# Patient Record
Sex: Female | Born: 2000
Health system: Southern US, Community
[De-identification: ages and names within clinical notes are randomized; demographics above are authoritative.]

## PROBLEM LIST (undated history)

## (undated) DIAGNOSIS — E669 Obesity, unspecified: Secondary | ICD-10-CM

## (undated) HISTORY — PX: OTHER SURGICAL HISTORY: SHX169

## (undated) HISTORY — PX: FACIAL NERVE SURGERY: SHX630

## (undated) HISTORY — PX: EYE SURGERY: SHX253

## (undated) HISTORY — DX: Obesity, unspecified: E66.9

---
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
## 2019-01-03 DIAGNOSIS — M5415 Radiculopathy, thoracolumbar region: Secondary | ICD-10-CM | POA: Diagnosis not present

## 2019-01-03 DIAGNOSIS — M546 Pain in thoracic spine: Secondary | ICD-10-CM | POA: Diagnosis not present

## 2019-01-03 DIAGNOSIS — M9902 Segmental and somatic dysfunction of thoracic region: Secondary | ICD-10-CM | POA: Diagnosis not present

## 2019-01-03 DIAGNOSIS — M7918 Myalgia, other site: Secondary | ICD-10-CM | POA: Diagnosis not present

## 2019-01-04 DIAGNOSIS — M5415 Radiculopathy, thoracolumbar region: Secondary | ICD-10-CM | POA: Diagnosis not present

## 2019-01-04 DIAGNOSIS — M546 Pain in thoracic spine: Secondary | ICD-10-CM | POA: Diagnosis not present

## 2019-01-04 DIAGNOSIS — M9902 Segmental and somatic dysfunction of thoracic region: Secondary | ICD-10-CM | POA: Diagnosis not present

## 2019-01-04 DIAGNOSIS — M7918 Myalgia, other site: Secondary | ICD-10-CM | POA: Diagnosis not present

## 2019-01-06 DIAGNOSIS — M546 Pain in thoracic spine: Secondary | ICD-10-CM | POA: Diagnosis not present

## 2019-01-06 DIAGNOSIS — M9902 Segmental and somatic dysfunction of thoracic region: Secondary | ICD-10-CM | POA: Diagnosis not present

## 2019-01-06 DIAGNOSIS — M5415 Radiculopathy, thoracolumbar region: Secondary | ICD-10-CM | POA: Diagnosis not present

## 2019-01-06 DIAGNOSIS — M7918 Myalgia, other site: Secondary | ICD-10-CM | POA: Diagnosis not present

## 2019-05-12 DIAGNOSIS — Z01419 Encounter for gynecological examination (general) (routine) without abnormal findings: Secondary | ICD-10-CM | POA: Diagnosis not present

## 2019-05-12 DIAGNOSIS — Z309 Encounter for contraceptive management, unspecified: Secondary | ICD-10-CM | POA: Diagnosis not present

## 2019-05-12 DIAGNOSIS — Z6841 Body Mass Index (BMI) 40.0 and over, adult: Secondary | ICD-10-CM | POA: Diagnosis not present

## 2019-05-12 DIAGNOSIS — Z803 Family history of malignant neoplasm of breast: Secondary | ICD-10-CM | POA: Diagnosis not present

## 2019-07-08 DIAGNOSIS — M545 Low back pain: Secondary | ICD-10-CM | POA: Diagnosis not present

## 2019-07-08 DIAGNOSIS — M62830 Muscle spasm of back: Secondary | ICD-10-CM | POA: Diagnosis not present

## 2019-07-08 DIAGNOSIS — M546 Pain in thoracic spine: Secondary | ICD-10-CM | POA: Diagnosis not present

## 2019-07-11 DIAGNOSIS — M546 Pain in thoracic spine: Secondary | ICD-10-CM | POA: Diagnosis not present

## 2019-07-11 DIAGNOSIS — M545 Low back pain: Secondary | ICD-10-CM | POA: Diagnosis not present

## 2019-07-11 DIAGNOSIS — M62830 Muscle spasm of back: Secondary | ICD-10-CM | POA: Diagnosis not present

## 2019-07-15 DIAGNOSIS — M545 Low back pain: Secondary | ICD-10-CM | POA: Diagnosis not present

## 2019-07-15 DIAGNOSIS — M62830 Muscle spasm of back: Secondary | ICD-10-CM | POA: Diagnosis not present

## 2019-07-15 DIAGNOSIS — M546 Pain in thoracic spine: Secondary | ICD-10-CM | POA: Diagnosis not present

## 2019-07-18 DIAGNOSIS — M546 Pain in thoracic spine: Secondary | ICD-10-CM | POA: Diagnosis not present

## 2019-07-18 DIAGNOSIS — M62830 Muscle spasm of back: Secondary | ICD-10-CM | POA: Diagnosis not present

## 2019-07-18 DIAGNOSIS — M545 Low back pain: Secondary | ICD-10-CM | POA: Diagnosis not present

## 2019-07-21 DIAGNOSIS — M546 Pain in thoracic spine: Secondary | ICD-10-CM | POA: Diagnosis not present

## 2019-07-21 DIAGNOSIS — M545 Low back pain: Secondary | ICD-10-CM | POA: Diagnosis not present

## 2019-07-21 DIAGNOSIS — M62830 Muscle spasm of back: Secondary | ICD-10-CM | POA: Diagnosis not present

## 2019-07-28 DIAGNOSIS — M62830 Muscle spasm of back: Secondary | ICD-10-CM | POA: Diagnosis not present

## 2019-07-28 DIAGNOSIS — M545 Low back pain: Secondary | ICD-10-CM | POA: Diagnosis not present

## 2019-07-28 DIAGNOSIS — M546 Pain in thoracic spine: Secondary | ICD-10-CM | POA: Diagnosis not present

## 2019-08-11 DIAGNOSIS — M546 Pain in thoracic spine: Secondary | ICD-10-CM | POA: Diagnosis not present

## 2019-08-11 DIAGNOSIS — M62830 Muscle spasm of back: Secondary | ICD-10-CM | POA: Diagnosis not present

## 2019-08-11 DIAGNOSIS — M545 Low back pain: Secondary | ICD-10-CM | POA: Diagnosis not present

## 2019-09-05 DIAGNOSIS — M62830 Muscle spasm of back: Secondary | ICD-10-CM | POA: Diagnosis not present

## 2019-09-05 DIAGNOSIS — M546 Pain in thoracic spine: Secondary | ICD-10-CM | POA: Diagnosis not present

## 2019-09-05 DIAGNOSIS — M545 Low back pain: Secondary | ICD-10-CM | POA: Diagnosis not present

## 2019-09-26 DIAGNOSIS — M545 Low back pain: Secondary | ICD-10-CM | POA: Diagnosis not present

## 2019-09-26 DIAGNOSIS — M62830 Muscle spasm of back: Secondary | ICD-10-CM | POA: Diagnosis not present

## 2019-09-26 DIAGNOSIS — M546 Pain in thoracic spine: Secondary | ICD-10-CM | POA: Diagnosis not present

## 2019-10-08 DIAGNOSIS — M545 Low back pain: Secondary | ICD-10-CM | POA: Diagnosis not present

## 2019-10-08 DIAGNOSIS — M62830 Muscle spasm of back: Secondary | ICD-10-CM | POA: Diagnosis not present

## 2019-10-08 DIAGNOSIS — M546 Pain in thoracic spine: Secondary | ICD-10-CM | POA: Diagnosis not present

## 2019-11-12 DIAGNOSIS — R062 Wheezing: Secondary | ICD-10-CM | POA: Diagnosis not present

## 2019-11-12 DIAGNOSIS — R05 Cough: Secondary | ICD-10-CM | POA: Diagnosis not present

## 2020-01-18 ENCOUNTER — Emergency Department (HOSPITAL_COMMUNITY)
Admission: EM | Admit: 2020-01-18 | Discharge: 2020-01-19 | Disposition: A | Discharge status: Home / Self Care | Payer: BC Managed Care – PPO | Attending: Emergency Medicine | Admitting: Emergency Medicine

## 2020-01-18 ENCOUNTER — Encounter (HOSPITAL_COMMUNITY): Payer: Self-pay

## 2020-01-18 ENCOUNTER — Other Ambulatory Visit: Payer: Self-pay

## 2020-01-18 DIAGNOSIS — G4489 Other headache syndrome: Secondary | ICD-10-CM | POA: Diagnosis not present

## 2020-01-18 DIAGNOSIS — I499 Cardiac arrhythmia, unspecified: Secondary | ICD-10-CM | POA: Diagnosis not present

## 2020-01-18 DIAGNOSIS — R519 Headache, unspecified: Secondary | ICD-10-CM | POA: Insufficient documentation

## 2020-01-18 DIAGNOSIS — Z79899 Other long term (current) drug therapy: Secondary | ICD-10-CM | POA: Insufficient documentation

## 2020-01-18 DIAGNOSIS — R52 Pain, unspecified: Secondary | ICD-10-CM | POA: Diagnosis not present

## 2020-01-18 DIAGNOSIS — R42 Dizziness and giddiness: Secondary | ICD-10-CM | POA: Insufficient documentation

## 2020-01-18 DIAGNOSIS — Z20822 Contact with and (suspected) exposure to covid-19: Secondary | ICD-10-CM | POA: Insufficient documentation

## 2020-01-18 DIAGNOSIS — R0902 Hypoxemia: Secondary | ICD-10-CM | POA: Diagnosis not present

## 2020-01-18 DIAGNOSIS — M542 Cervicalgia: Secondary | ICD-10-CM | POA: Diagnosis not present

## 2020-01-18 LAB — COOXEMETRY PANEL
Carboxyhemoglobin: 1 % (ref 0.5–1.5)
Methemoglobin: 0.6 % (ref 0.0–1.5)
O2 Saturation: 96 %
Total hemoglobin: 13.2 g/dL (ref 12.0–16.0)

## 2020-01-18 LAB — BASIC METABOLIC PANEL
Anion gap: 9 (ref 5–15)
BUN: 8 mg/dL (ref 6–20)
CO2: 23 mmol/L (ref 22–32)
Calcium: 9.2 mg/dL (ref 8.9–10.3)
Chloride: 108 mmol/L (ref 98–111)
Creatinine, Ser: 0.91 mg/dL (ref 0.44–1.00)
GFR calc Af Amer: >60 mL/min (ref >60)
GFR calc non Af Amer: >60 mL/min (ref >60)
Glucose, Bld: 91 mg/dL (ref 70–99)
Potassium: 3.9 mmol/L (ref 3.5–5.1)
Sodium: 140 mmol/L (ref 135–145)

## 2020-01-18 LAB — URINALYSIS, ROUTINE W REFLEX MICROSCOPIC
Bilirubin Urine: NEGATIVE
Glucose, UA: NEGATIVE mg/dL
Hgb urine dipstick: NEGATIVE
Ketones, ur: NEGATIVE mg/dL
Leukocytes,Ua: NEGATIVE
Nitrite: NEGATIVE
Protein, ur: NEGATIVE mg/dL
Specific Gravity, Urine: 1.023 (ref 1.005–1.030)
pH: 5 (ref 5.0–8.0)

## 2020-01-18 LAB — CBC
HCT: 41.3 % (ref 36.0–46.0)
Hemoglobin: 13.5 g/dL (ref 12.0–15.0)
MCH: 29.8 pg (ref 26.0–34.0)
MCHC: 32.7 g/dL (ref 30.0–36.0)
MCV: 91.2 fL (ref 80.0–100.0)
Platelets: 341 10*3/uL (ref 150–400)
RBC: 4.53 MIL/uL (ref 3.87–5.11)
RDW: 12.5 % (ref 11.5–15.5)
WBC: 8.7 10*3/uL (ref 4.0–10.5)
nRBC: 0 % (ref 0.0–0.2)

## 2020-01-18 LAB — I-STAT BETA HCG BLOOD, ED (MC, WL, AP ONLY): I-stat hCG, quantitative: <5 m[IU]/mL (ref <5)

## 2020-01-18 NOTE — ED Notes (Signed)
2100 poison control called suggesting some ;ab studies for this pt carboxy hgb cmp  Pt had brain surgery in the past

## 2020-01-18 NOTE — ED Notes (Signed)
Had lengthy conversation with sister Aundra Millet regarding her concerns of patient status. Sister reports that pt spent the weekend at the beach with her friends and slept in a garage with a space heater. Sister is concerned for possible carbon monoxide poisoning because all the friends she went with had similar sx of HA and nausea. Sister contacted poison control herself to get advice. Poison control contacted this ED and appropriate labs ordered. Sister also reports that she has a hx of chiari malformation w/ surgery in 2012. Sister reports that she is also concerned the HA could be complications with the chairi malformation, pt is neuro intact. Sister also reports pt vapes, smokes marijuana and takes OCP, and she is concerned that she is high risk for clots. All of the concerns were noted and relayed to MD Trifan. See orders for additional orders, per MD Trifan, no CT head indicated at this time.

## 2020-01-18 NOTE — ED Notes (Signed)
Aundra Millet sister [phone] would like to speak with a nurse

## 2020-01-18 NOTE — ED Triage Notes (Signed)
Pt from home with ems c.o headache, nausea, dizziness and generalized fatigue since Sunday night. States she went to a beach house with friends this weekend and they are starting to have similar symptoms. Pt arrives alert and oriented.

## 2020-01-19 ENCOUNTER — Emergency Department (HOSPITAL_COMMUNITY): Payer: BC Managed Care – PPO

## 2020-01-19 DIAGNOSIS — R519 Headache, unspecified: Secondary | ICD-10-CM | POA: Diagnosis not present

## 2020-01-19 LAB — POC SARS CORONAVIRUS 2 AG -  ED: SARS Coronavirus 2 Ag: NEGATIVE

## 2020-01-19 MED ORDER — DEXAMETHASONE SODIUM PHOSPHATE 10 MG/ML IJ SOLN
10.0000 mg | Freq: Once | INTRAMUSCULAR | Status: AC
Start: 2020-01-19 — End: 2020-01-19
  Administered 2020-01-19: 10 mg via INTRAVENOUS
  Filled 2020-01-19: qty 1

## 2020-01-19 MED ORDER — KETOROLAC TROMETHAMINE 15 MG/ML IJ SOLN
15.0000 mg | Freq: Once | INTRAMUSCULAR | Status: AC
  Administered 2020-01-19: 04:00:00 15 mg via INTRAVENOUS
  Filled 2020-01-19: qty 1

## 2020-01-19 MED ORDER — SODIUM CHLORIDE 0.9 % IV BOLUS
1000.0000 mL | Freq: Once | INTRAVENOUS | Status: AC
  Administered 2020-01-19: 04:00:00 1000 mL via INTRAVENOUS

## 2020-01-19 MED ORDER — METOCLOPRAMIDE HCL 5 MG/ML IJ SOLN
10.0000 mg | Freq: Once | INTRAMUSCULAR | Status: AC
  Administered 2020-01-19: 04:00:00 10 mg via INTRAVENOUS
  Filled 2020-01-19: qty 2

## 2020-01-19 NOTE — ED Notes (Signed)
Pt came to the ED per triage complaint. Pt conscious, breathing, and A&Ox4. Pt ambulated to bay 21 with a smooth and steady gait. Pt endorses "I had a headache that started yesterday that hasn't got better and I think from a space heater". Chest rise and fall equally with non-labored breathing. Lungs clear apex to base. Abd soft and non-tender. Pt denies chest pain, n/v/d, shortness of breath, and f/c. PIVC placed on the RAC with a 20G which had positive blood return and flushed without pain or infiltration. Pt on continuous blood pressure, pulse ox, and cardiac monitor. Will continue to monitor. Awaiting MD eval. No distress noted.

## 2020-01-19 NOTE — ED Provider Notes (Signed)
[facility] EMERGENCY DEPARTMENT Provider Note   CSN: [id number] Arrival date & time: 01/18/20  1733     History Chief Complaint  Patient presents with  . Fatigue  . Dizziness  . Nausea    Cindy Howard is a 19 y.o. female presenting for evaluation of headache and tenderness.  Patient states for the past 3 days, she is not been feeling well.  She reports generalized fatigue and an occipital headache.  Patient states she was having nausea and blurry vision, but these have resolved.  Patient was recently on vacation with some friends, they were exhibiting similar symptoms.  As such, she was concerned about carbon oxide poisoning.  Patient denies fevers, chills, nasal congestion, sore throat, cough, chest pain, abdominal pain, urinary symptoms, normal bowel movements.  She has a history of Chiari malformation status post decompression.  She was told that as she had decompression centrally age, she will likely need intervention again in the future.  Patient states her symptoms when she was having issues with Chiari malformation included an occipital headache which is what she has currently.  Patient states she took 1 dose of ibuprofen without improvement of symptoms.  She has not tried anything else.  She takes birth control, takes no other medicines.  HPI     Past Medical History:  Diagnosis Date  . Obesity     Patient Active Problem List   Diagnosis Date Noted  . Headache(784.0) 12/01/2011  . Chiari malformation 12/01/2011    Past Surgical History:  Procedure Laterality Date  . chari    . EYE SURGERY    . FACIAL NERVE SURGERY    . MASS EXCISION Left 11/12/2017   Procedure: EXCISION MASS PALM LEFT HAND;  Surgeon: Betha Loa, MD;  Location: [facility];  Service: Orthopedics;  Laterality: Left;  . TONSILLECTOMY AND ADENOIDECTOMY  2003     OB History   No obstetric history on file.     Family History  Problem Relation Age of Onset  .  Cancer Other   . Hypertension Other   . Hyperlipidemia Other   . Heart attack Other   . Sleep apnea Other   . Obesity Other     Social History   Tobacco Use  . Smoking status: Never Smoker  . Smokeless tobacco: Never Used  Substance Use Topics  . Alcohol use: No  . Drug use: No    Home Medications Prior to Admission medications   Medication Sig Start Date End Date Taking? Authorizing Provider  HYDROcodone-acetaminophen [facility]) 5-325 MG tablet 1 tab po q6 hours prn pain 11/12/17   Betha Loa, MD    Allergies    No known allergies  Review of Systems   Review of Systems  Constitutional: Positive for fatigue.  Eyes: Positive for visual disturbance (Resolved).  Gastrointestinal: Positive for nausea (Resolved).  Neurological: Positive for headaches.  All other systems reviewed and are negative.   Physical Exam Updated Vital Signs BP 128/82 (BP Location: Right Arm)   Pulse 62   Temp 98.8 F (37.1 C) (Oral)   Resp 18   SpO2 98%   Physical Exam Vitals and nursing note reviewed.  Constitutional:      General: She is not in acute distress.    Appearance: She is well-developed. She is obese.     Comments: Resting comfortably in bed in no acute distress  HENT:     Head: Normocephalic and atraumatic.     Comments: OP  clear without tonsillar swelling exudate.  Uvula midline. Eyes:     Extraocular Movements: Extraocular movements intact.     Conjunctiva/sclera: Conjunctivae normal.     Pupils: Pupils are equal, round, and reactive to light.     Comments: EOMI and PERRLA.  No nystagmus.  Cardiovascular:     Rate and Rhythm: Normal rate and regular rhythm.     Pulses: Normal pulses.  Pulmonary:     Effort: Pulmonary effort is normal. No respiratory distress.     Breath sounds: Normal breath sounds. No wheezing.     Comments: Clear lung sounds in all fields.  Speaking full sentences. Abdominal:     General: There is no distension.     Palpations: Abdomen is soft.  There is no mass.     Tenderness: There is no abdominal tenderness. There is no guarding or rebound.  Musculoskeletal:        General: Normal range of motion.     Cervical back: Normal range of motion and neck supple.     Right lower leg: No edema.     Left lower leg: No edema.  Skin:    General: Skin is warm and dry.     Capillary Refill: Capillary refill takes less than 2 seconds.  Neurological:     Mental Status: She is alert and oriented to person, place, and time.     GCS: GCS eye subscore is 4. GCS verbal subscore is 5. GCS motor subscore is 6.     Cranial Nerves: Cranial nerves are intact.     Sensory: Sensation is intact.     Motor: Motor function is intact.     Coordination: Coordination is intact.     Comments: No obvious neurologic deficits     ED Results / Procedures / Treatments   Labs (all labs ordered are listed, but only abnormal results are displayed) Labs Reviewed  BASIC METABOLIC PANEL  CBC  URINALYSIS, ROUTINE W REFLEX MICROSCOPIC  COOXEMETRY PANEL  BLOOD GAS, ARTERIAL  I-STAT BETA HCG BLOOD, ED ([facility], [facility], [facility] ONLY)  POC SARS CORONAVIRUS 2 AG -  ED    EKG None  Radiology CT Head Wo Contrast  Result Date: 01/19/2020 CLINICAL DATA:  Headaches, history of Chiari malformation with decompression EXAM: CT HEAD WITHOUT CONTRAST TECHNIQUE: Contiguous axial images were obtained from the base of the skull through the vertex without intravenous contrast. COMPARISON:  MRI from 12/01/2011 FINDINGS: Brain: No findings to suggest acute hemorrhage, acute infarction or space-occupying mass lesion are noted. There are changes consistent with posterior occipital decompression. The inferior herniation of the cerebellar tonsils seen previously has resolved. No obstructive changes are seen. Vascular: No hyperdense vessel or unexpected calcification. Skull: Changes consistent with posterior occipital decompression are noted. Sinuses/Orbits: No acute finding. Other: None. IMPRESSION:  Postsurgical changes. No recurrent tonsillar herniation is identified. No acute abnormality noted. Electronically Signed   By: Alcide Clever M.D.   On: 01/19/2020 03:58    Procedures Procedures (including critical care time)  Medications Ordered in ED Medications  sodium chloride 0.9 % bolus 1,000 mL (0 mLs Intravenous Stopped 01/19/20 0400)  ketorolac (TORADOL) 15 MG/ML injection 15 mg (15 mg Intravenous Given 01/19/20 0357)  metoCLOPramide (REGLAN) injection 10 mg (10 mg Intravenous Given 01/19/20 0358)  dexamethasone (DECADRON) injection 10 mg (10 mg Intravenous Given 01/19/20 0452)    ED Course  I have reviewed the triage vital signs and the nursing notes.  Pertinent labs & imaging results that were available  during my care of the patient were reviewed by me and considered in my medical decision making (see chart for details).    MDM Rules/Calculators/A&P                      Patient presenting for evaluation of headache and generalized weakness.  Physical exam shows patient appears nontoxic.  She is neurologically intact.  She is concerned about possible carbon monoxide poisoning, however labs obtained from triage are reassuring including the carboxy panel.  As such, low suspicion for this.  Consider viral illness including Covid.  Will obtain rapid Covid test.  Consider migraine headache.  Consider recurrence of your inflammation, pain CT head for further evaluation.  Consider pseudotumor, although patient's blurry vision resolved without intervention, no neurologic deficits at this time.  CT head negative for acute findings.  Covid test negative.  On reassessment after headache cocktail, patient reports symptoms are completely resolved.  Discussed continued symptomatic treatment as needed and follow-up with primary care.  At this time, patient appears safe for discharge.  Return precautions given.  Patient states she understands and agrees to plan.  Final Clinical Impression(s) / ED  Diagnoses Final diagnoses:  Acute nonintractable headache, unspecified headache type    Rx / DC Orders ED Discharge Orders    None       Franchot Heidelberg, PA-C 01/19/20 6629    Mesner, Corene Cornea, MD 01/19/20 [phone]

## 2020-01-19 NOTE — Discharge Instructions (Addendum)
Make sure you are staying well-hydrated water. Use Tylenol or ibuprofen as needed for recurrence of headache. Follow-up with your primary care doctor as needed for further evaluation if your headaches persist. Return to the emergency room with any new, worsening, or concerning symptoms.

## 2020-01-20 DIAGNOSIS — Z6841 Body Mass Index (BMI) 40.0 and over, adult: Secondary | ICD-10-CM | POA: Diagnosis not present

## 2020-01-20 DIAGNOSIS — R030 Elevated blood-pressure reading, without diagnosis of hypertension: Secondary | ICD-10-CM | POA: Diagnosis not present

## 2020-01-27 DIAGNOSIS — Z23 Encounter for immunization: Secondary | ICD-10-CM | POA: Diagnosis not present

## 2020-01-27 DIAGNOSIS — Z6841 Body Mass Index (BMI) 40.0 and over, adult: Secondary | ICD-10-CM | POA: Diagnosis not present

## 2020-01-27 DIAGNOSIS — Z13220 Encounter for screening for lipoid disorders: Secondary | ICD-10-CM | POA: Diagnosis not present

## 2020-01-27 DIAGNOSIS — Z0000 Encounter for general adult medical examination without abnormal findings: Secondary | ICD-10-CM | POA: Diagnosis not present

## 2020-03-29 DIAGNOSIS — Z23 Encounter for immunization: Secondary | ICD-10-CM | POA: Diagnosis not present

## 2020-05-14 DIAGNOSIS — Z01419 Encounter for gynecological examination (general) (routine) without abnormal findings: Secondary | ICD-10-CM | POA: Diagnosis not present

## 2020-05-14 DIAGNOSIS — Z6841 Body Mass Index (BMI) 40.0 and over, adult: Secondary | ICD-10-CM | POA: Diagnosis not present

## 2020-05-14 DIAGNOSIS — Z309 Encounter for contraceptive management, unspecified: Secondary | ICD-10-CM | POA: Diagnosis not present

## 2020-05-14 DIAGNOSIS — Z803 Family history of malignant neoplasm of breast: Secondary | ICD-10-CM | POA: Diagnosis not present

## 2020-06-14 DIAGNOSIS — Z20822 Contact with and (suspected) exposure to covid-19: Secondary | ICD-10-CM | POA: Diagnosis not present

## 2020-06-14 DIAGNOSIS — U071 COVID-19: Secondary | ICD-10-CM | POA: Diagnosis not present

## 2020-08-13 DIAGNOSIS — A09 Infectious gastroenteritis and colitis, unspecified: Secondary | ICD-10-CM | POA: Diagnosis not present

## 2020-08-21 DIAGNOSIS — Z1389 Encounter for screening for other disorder: Secondary | ICD-10-CM | POA: Diagnosis not present

## 2021-04-14 DIAGNOSIS — Z7184 Encounter for health counseling related to travel: Secondary | ICD-10-CM | POA: Diagnosis not present

## 2021-05-27 DIAGNOSIS — Z113 Encounter for screening for infections with a predominantly sexual mode of transmission: Secondary | ICD-10-CM | POA: Diagnosis not present

## 2021-05-27 DIAGNOSIS — Z6841 Body Mass Index (BMI) 40.0 and over, adult: Secondary | ICD-10-CM | POA: Diagnosis not present

## 2021-05-27 DIAGNOSIS — Z01419 Encounter for gynecological examination (general) (routine) without abnormal findings: Secondary | ICD-10-CM | POA: Diagnosis not present

## 2021-06-13 DIAGNOSIS — R309 Painful micturition, unspecified: Secondary | ICD-10-CM | POA: Diagnosis not present

## 2021-08-08 DIAGNOSIS — H6123 Impacted cerumen, bilateral: Secondary | ICD-10-CM | POA: Diagnosis not present

## 2021-08-21 IMAGING — CT CT HEAD W/O CM
4 series · 16 of 47 positions shown, 18 images · non-contrast
Comparison: MRI from 12/01/2011

CLINICAL DATA: Headaches, history of Chiari malformation with
decompression

EXAM:
CT HEAD WITHOUT CONTRAST
TECHNIQUE: Contiguous axial images were obtained from the base of the skull
through the vertex without intravenous contrast.

[Series 3: head without · axial · non-contrast · 0.46mm/px · z∈[-78,+42]mm · 7 of 34 slices shown, 9 images]
[im 5/34  brain]
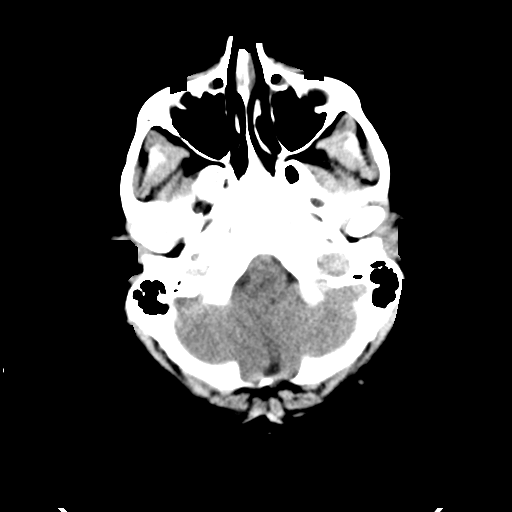
[im 5/34  bone]
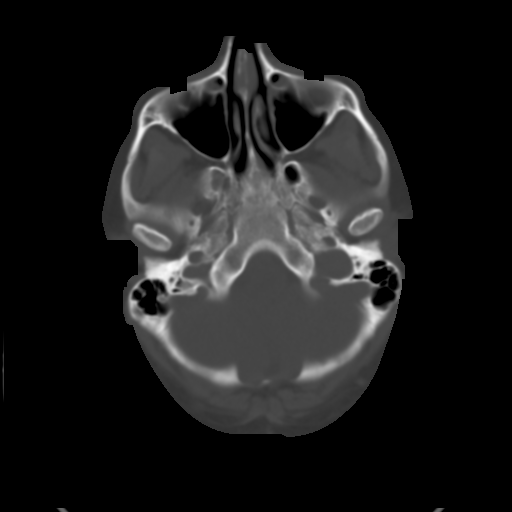
[im 9/34  brain]
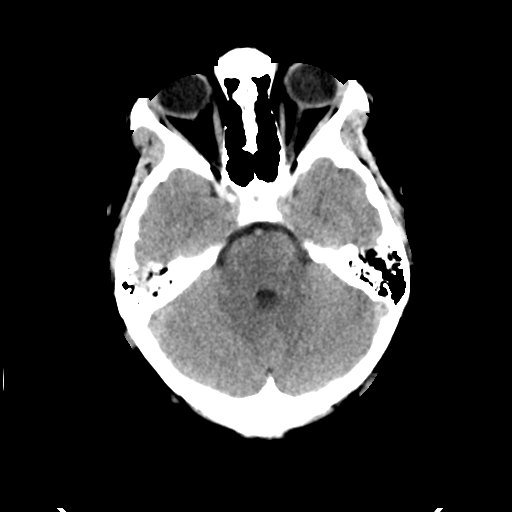
[im 13/34  brain]
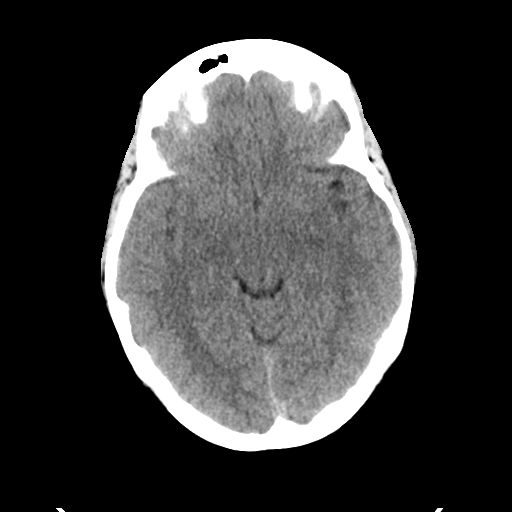
[im 17/34  brain]
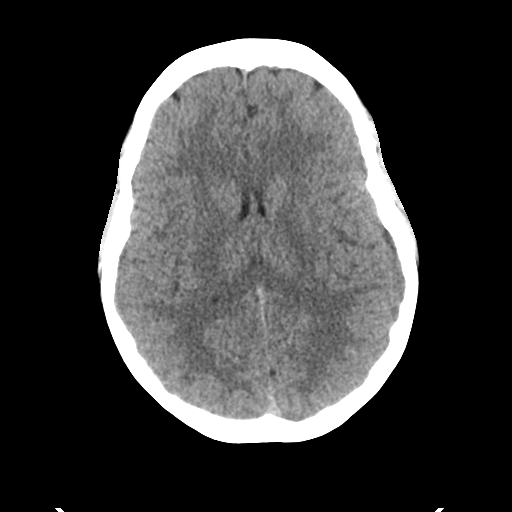
[im 21/34  brain]
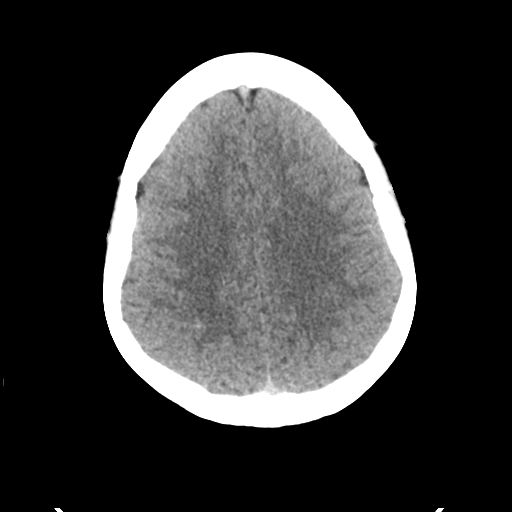
[im 21/34  bone]
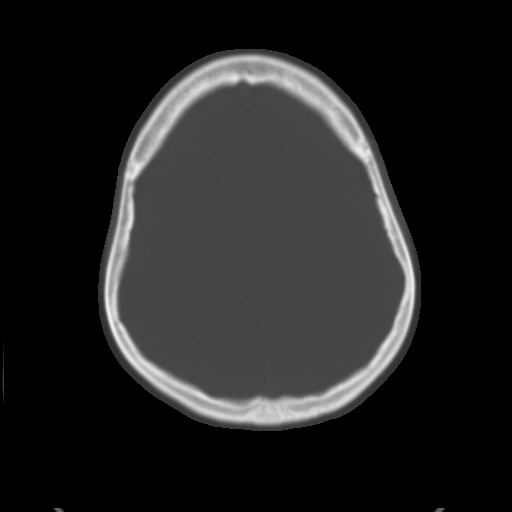
[im 25/34  brain]
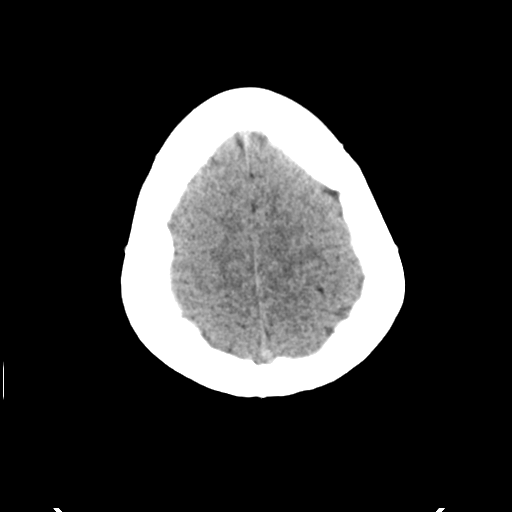
[im 29/34  brain]
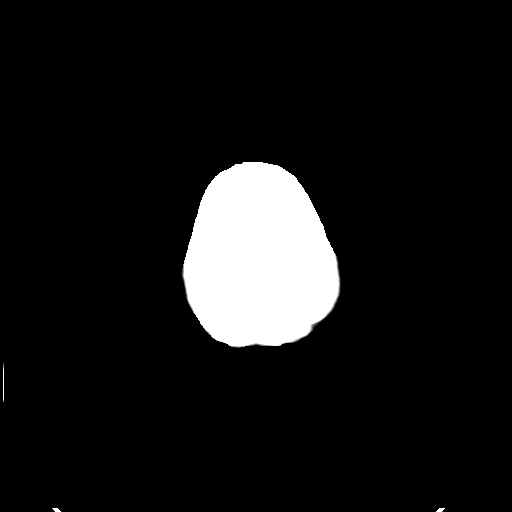

[Series 4: head bone · axial · 0.46mm/px · z∈[-82,-50]mm · 3 of 84 slices shown]
[im 9/84  bone]
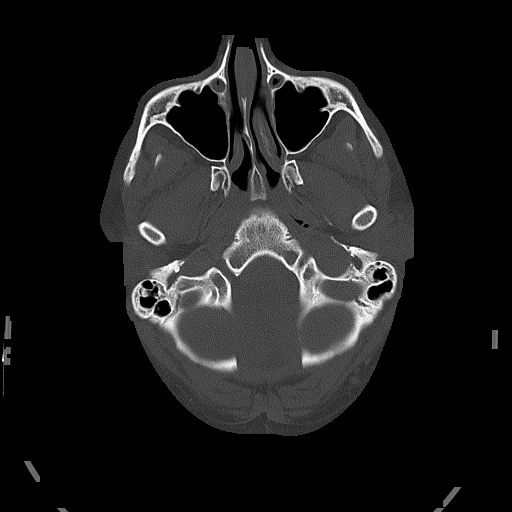
[im 17/84  bone]
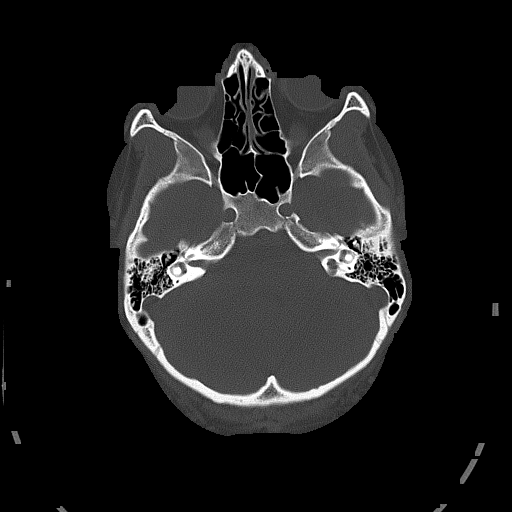
[im 25/84  bone]
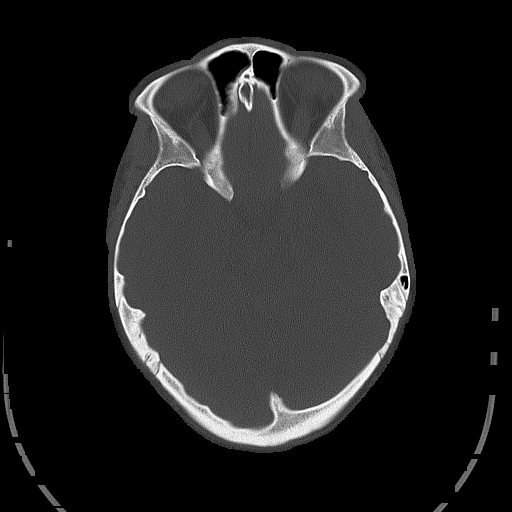

[Series 5: head without cor · coronal · non-contrast · 0.34mm/px · 3 of 70 slices shown]
[im 24/70  brain]
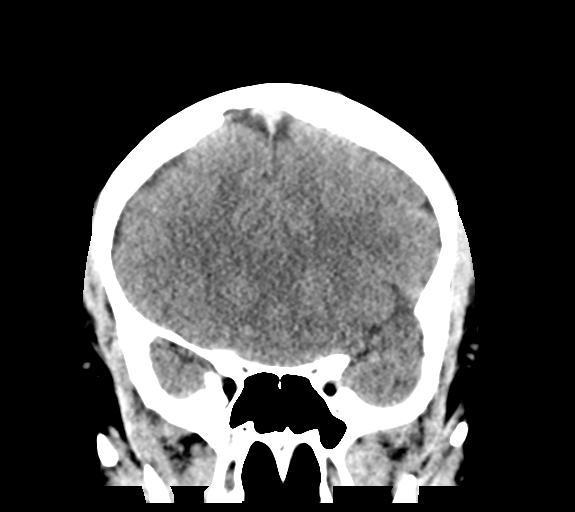
[im 31/70  brain]
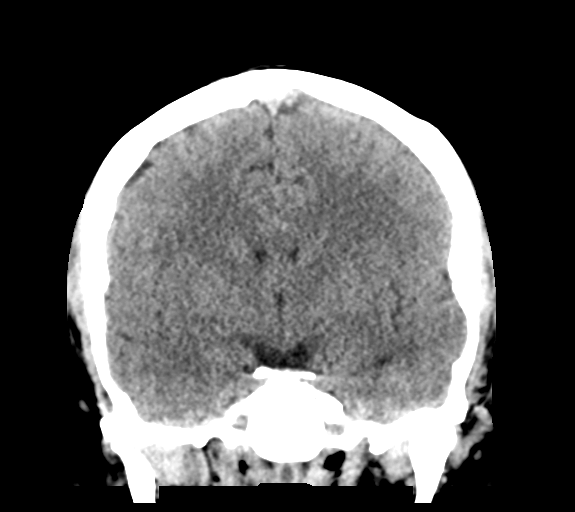
[im 39/70  brain]
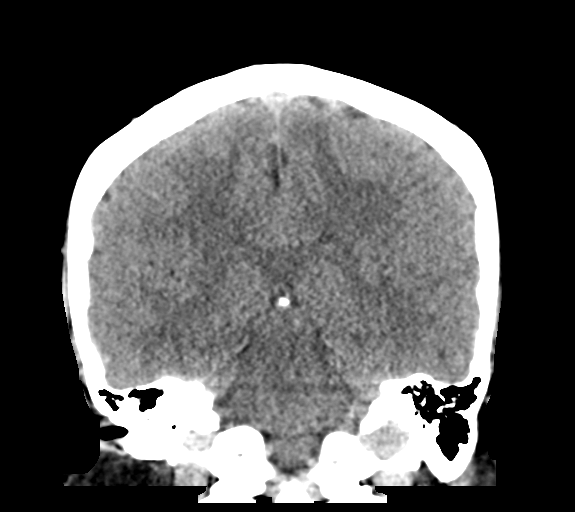

[Series 6: head without sag · sagittal · non-contrast · 0.34mm/px · 3 of 67 slices shown]
[im 23/67  brain]
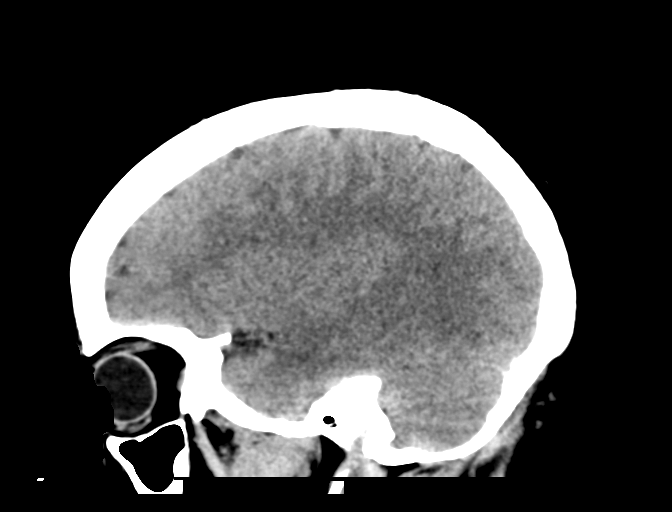
[im 34/67  brain]
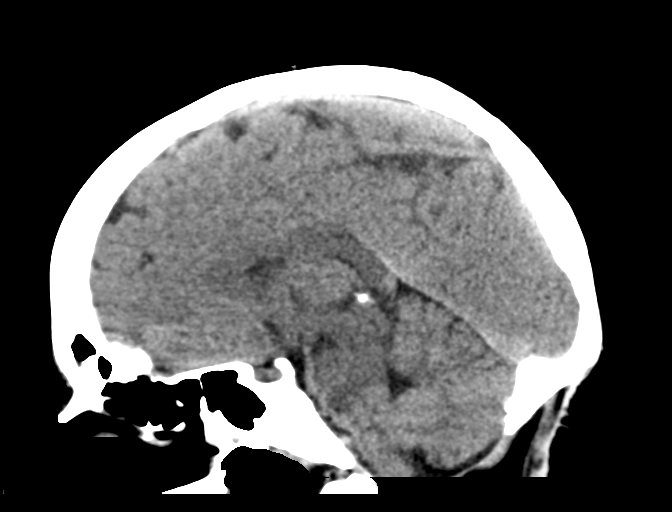
[im 45/67  brain]
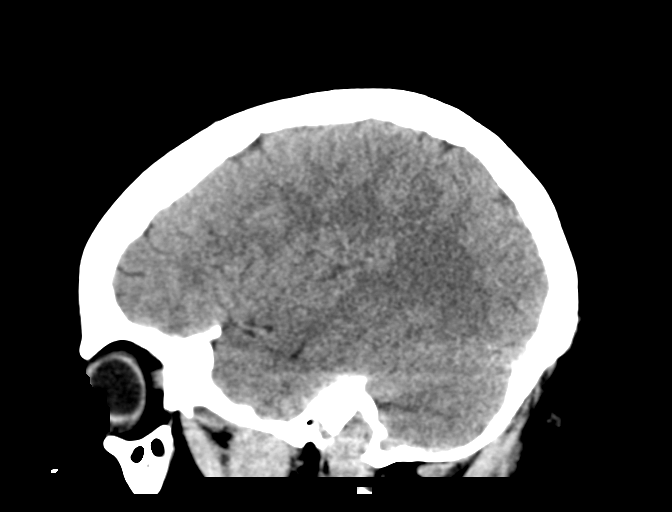

[16 of 47 positions shown; findings below may reference images not displayed]

FINDINGS: Brain: No findings to suggest acute hemorrhage, acute infarction or
space-occupying mass lesion are noted. There are changes consistent
with posterior occipital decompression. The inferior herniation of
the cerebellar tonsils seen previously has resolved. No obstructive
changes are seen.

Vascular: No hyperdense vessel or unexpected calcification.

Skull: Changes consistent with posterior occipital decompression are
noted.

Sinuses/Orbits: No acute finding.

Other: None.
IMPRESSION: Postsurgical changes. No recurrent tonsillar herniation is
identified.

No acute abnormality noted.

## 2022-02-10 DIAGNOSIS — H1030 Unspecified acute conjunctivitis, unspecified eye: Secondary | ICD-10-CM | POA: Diagnosis not present
